# Patient Record
Sex: Male | Born: 2000 | Race: Black or African American | Hispanic: No | Marital: Single | Smoking: Never smoker
Health system: Southern US, Community
[De-identification: ages and names within clinical notes are randomized; demographics above are authoritative.]

## PROBLEM LIST (undated history)

## (undated) DIAGNOSIS — Z789 Other specified health status: Secondary | ICD-10-CM

## (undated) HISTORY — DX: Other specified health status: Z78.9

## (undated) HISTORY — PX: OTHER SURGICAL HISTORY: SHX169

---
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
## 2022-06-29 IMAGING — US US BREAST*L* LIMITED INC AXILLA
1 series · 4 of 4 positions shown · non-contrast
Comparison: None.

CLINICAL DATA: 18-year-old male with a tender, palpable lump behind
the left nipple for approximately 6 months.

EXAM:
ULTRASOUND OF THE LEFT BREAST

[Series 1: us breast*left* limited inc axilla · 0.06mm/px · 4 of 4 slices shown]
[im 1/4]
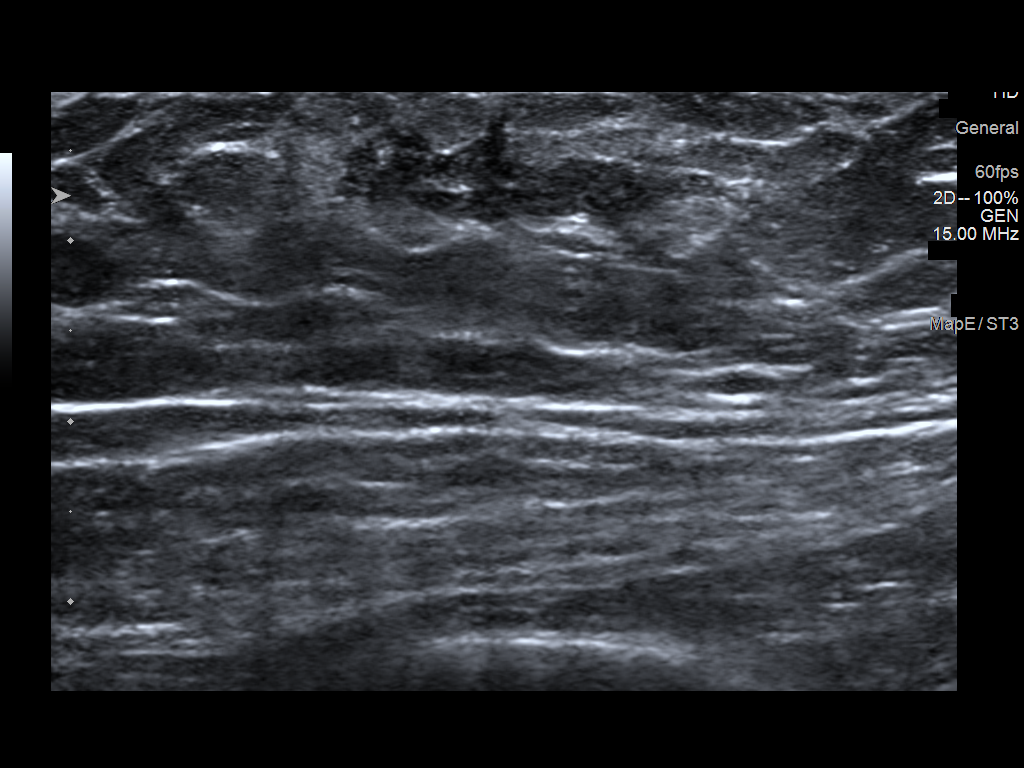
[im 2/4]
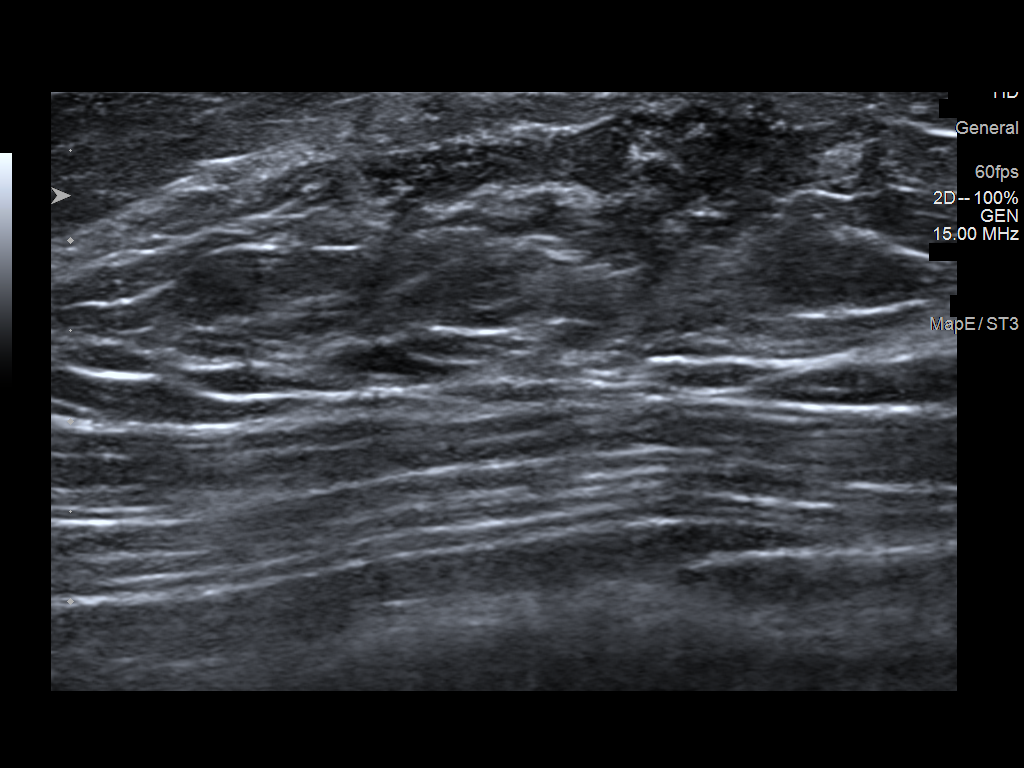
[im 3/4]
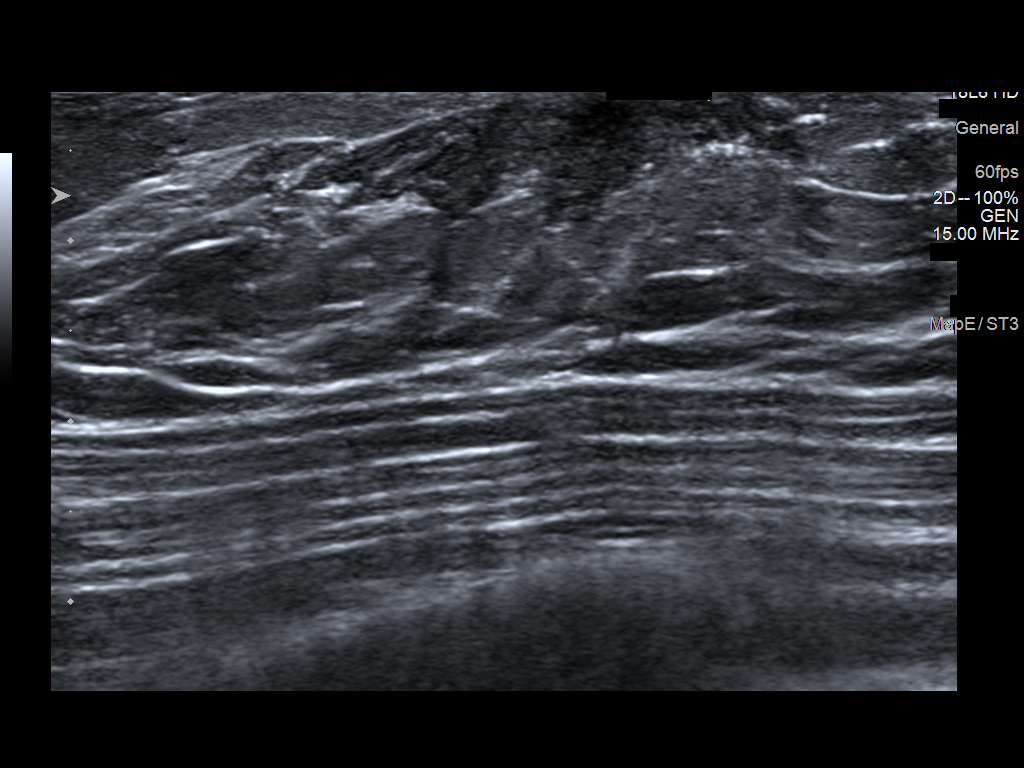
[im 4/4]
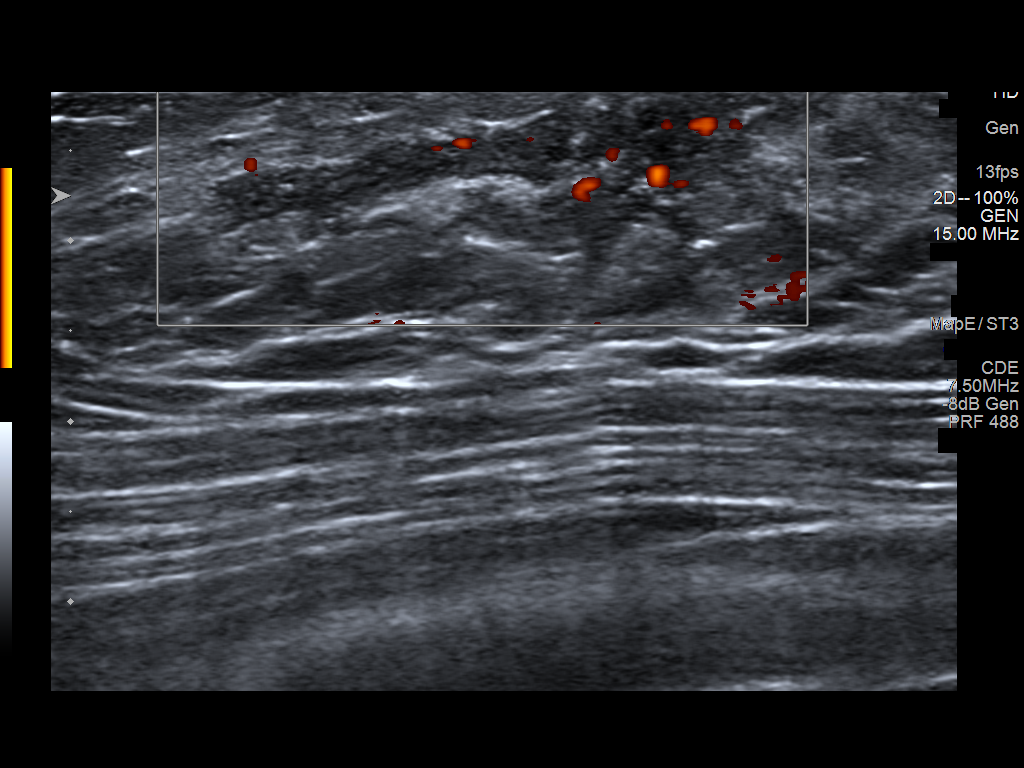

[4 of 4 positions shown; findings below may reference images not displayed]

FINDINGS: Targeted ultrasound is performed, showing mild gynecomastia in the
subareolar left breast. No focal or suspicious sonographic
abnormalities are identified.
IMPRESSION: Mild left breast gynecomastia.

RECOMMENDATION:
I discussed with the patient the fact that gynecomastia can occur in
older men as testosterone levels decrease with age or in younger men
with low testosterone levels, causing a change in the serum
testosterone:estrogen ratio. We also discussed other potential
etiologies of gynecomastia including numerous prescription
medications and recreational drugs (marijuana and anabolic steroids
in particular). I counseled the patient to perform self-examination
to make sure that a hard lump does not form which could indicate
malignancy and would need further evaluation. We also discussed the
possibility of surgical excision if symptoms continue and if an
etiology of the gynecomastia cannot be determined and therefore
corrected.

I have discussed the findings and recommendations with the patient.
If applicable, a reminder letter will be sent to the patient
regarding the next appointment.

BI-RADS CATEGORY  2: Benign.

## 2023-03-01 ENCOUNTER — Emergency Department (HOSPITAL_COMMUNITY)
Admission: EM | Admit: 2023-03-01 | Discharge: 2023-03-01 | Disposition: A | Discharge status: Home / Self Care | Payer: Federal, State, Local not specified - PPO | Attending: Emergency Medicine | Admitting: Emergency Medicine

## 2023-03-01 ENCOUNTER — Emergency Department (HOSPITAL_COMMUNITY): Payer: Federal, State, Local not specified - PPO

## 2023-03-01 ENCOUNTER — Encounter (HOSPITAL_COMMUNITY): Payer: Self-pay | Admitting: Emergency Medicine

## 2023-03-01 DIAGNOSIS — B338 Other specified viral diseases: Secondary | ICD-10-CM

## 2023-03-01 DIAGNOSIS — R197 Diarrhea, unspecified: Secondary | ICD-10-CM | POA: Diagnosis not present

## 2023-03-01 DIAGNOSIS — B974 Respiratory syncytial virus as the cause of diseases classified elsewhere: Secondary | ICD-10-CM | POA: Insufficient documentation

## 2023-03-01 DIAGNOSIS — Z20822 Contact with and (suspected) exposure to covid-19: Secondary | ICD-10-CM | POA: Diagnosis not present

## 2023-03-01 DIAGNOSIS — R1084 Generalized abdominal pain: Secondary | ICD-10-CM | POA: Diagnosis present

## 2023-03-01 DIAGNOSIS — R112 Nausea with vomiting, unspecified: Secondary | ICD-10-CM | POA: Insufficient documentation

## 2023-03-01 LAB — URINALYSIS, ROUTINE W REFLEX MICROSCOPIC
Bilirubin Urine: NEGATIVE
Glucose, UA: NEGATIVE mg/dL
Hgb urine dipstick: NEGATIVE
Ketones, ur: NEGATIVE mg/dL
Leukocytes,Ua: NEGATIVE
Nitrite: NEGATIVE
Protein, ur: NEGATIVE mg/dL
Specific Gravity, Urine: 1.024 (ref 1.005–1.030)
pH: 5 (ref 5.0–8.0)

## 2023-03-01 LAB — COMPREHENSIVE METABOLIC PANEL
ALT: 21 U/L (ref 0–44)
AST: 20 U/L (ref 15–41)
Albumin: 4.5 g/dL (ref 3.5–5.0)
Alkaline Phosphatase: 45 U/L (ref 38–126)
Anion gap: 11 (ref 5–15)
BUN: 9 mg/dL (ref 6–20)
CO2: 25 mmol/L (ref 22–32)
Calcium: 9.5 mg/dL (ref 8.9–10.3)
Chloride: 100 mmol/L (ref 98–111)
Creatinine, Ser: 0.77 mg/dL (ref 0.61–1.24)
GFR, Estimated: >60 mL/min (ref >60)
Glucose, Bld: 100 mg/dL — ABNORMAL HIGH (ref 70–99)
Potassium: 3.7 mmol/L (ref 3.5–5.1)
Sodium: 136 mmol/L (ref 135–145)
Total Bilirubin: 0.9 mg/dL (ref 0.0–1.2)
Total Protein: 7.8 g/dL (ref 6.5–8.1)

## 2023-03-01 LAB — CBC
HCT: 45.5 % (ref 39.0–52.0)
Hemoglobin: 15.1 g/dL (ref 13.0–17.0)
MCH: 28.6 pg (ref 26.0–34.0)
MCHC: 33.2 g/dL (ref 30.0–36.0)
MCV: 86.2 fL (ref 80.0–100.0)
Platelets: 330 10*3/uL (ref 150–400)
RBC: 5.28 MIL/uL (ref 4.22–5.81)
RDW: 13.2 % (ref 11.5–15.5)
WBC: 7 10*3/uL (ref 4.0–10.5)
nRBC: 0 % (ref 0.0–0.2)

## 2023-03-01 LAB — LIPASE, BLOOD: Lipase: 43 U/L (ref 11–51)

## 2023-03-01 LAB — RESP PANEL BY RT-PCR (RSV, FLU A&B, COVID)  RVPGX2
Influenza A by PCR: NEGATIVE
Influenza B by PCR: NEGATIVE
Resp Syncytial Virus by PCR: POSITIVE — AB
SARS Coronavirus 2 by RT PCR: NEGATIVE

## 2023-03-01 MED ORDER — OMEPRAZOLE 20 MG PO CPDR
20.0000 mg | DELAYED_RELEASE_CAPSULE | Freq: Every day | ORAL | 0 refills | Status: AC
Start: 2023-03-01 — End: ?

## 2023-03-01 MED ORDER — IOHEXOL 300 MG/ML  SOLN
100.0000 mL | Freq: Once | INTRAMUSCULAR | Status: AC | PRN
  Administered 2023-03-01: 100 mL via INTRAVENOUS

## 2023-03-01 MED ORDER — ONDANSETRON HCL 4 MG PO TABS: 4.0000 mg | ORAL_TABLET | Freq: Three times a day (TID) | ORAL | 0 refills | Status: AC | PRN

## 2023-03-01 NOTE — ED Triage Notes (Signed)
Pt here from home with c/o abd pain along with along with N/V/D off and on over the last 2 weeks

## 2023-03-01 NOTE — ED Provider Notes (Signed)
[place] EMERGENCY DEPARTMENT AT [facility] Provider Note   CSN: [id number] Arrival date & time: 03/01/23  4098     History  Chief Complaint  Patient presents with   Abdominal Pain    Nathaniel Hogan is a 23 y.o. male.  He has no significant past medical history.  He has had GI problems for the last few weeks.  He initially thought it was bad fish, had nausea vomiting diarrhea.  Symptoms recurred again about a week ago with same.  Had ongoing cramps and lack of appetite since then with symptoms worsening last evening.  No fevers or chills.  Generalized abdominal pain.  Thought he felt a bulge come out of his abdominal wall but is not there now.  Has family history of colon cancer and lymphoma and so wanted to get checked out.  Has been trying over-the-counter Tums and probiotics.  Also has some URI symptoms  The history is provided by the patient.  Abdominal Pain Pain location:  Generalized Pain quality: aching   Pain severity:  Moderate Onset quality:  Gradual Duration:  2 weeks Timing:  Intermittent Progression:  Unchanged Chronicity:  New Context: not trauma   Relieved by:  Nothing Worsened by:  Nothing Ineffective treatments:  Antacids Associated symptoms: chest pain, diarrhea, nausea and vomiting   Associated symptoms: no constipation, no cough, no dysuria, no fever, no hematemesis, no hematochezia, no hematuria and no shortness of breath   Risk factors: has not had multiple surgeries        Home Medications Prior to Admission medications   Not on File      Allergies    Patient has no known allergies.    Review of Systems   Review of Systems  Constitutional:  Negative for fever.  Respiratory:  Negative for cough and shortness of breath.   Cardiovascular:  Positive for chest pain.  Gastrointestinal:  Positive for abdominal pain, diarrhea, nausea and vomiting. Negative for constipation, hematemesis and hematochezia.  Genitourinary:  Negative for  dysuria and hematuria.    Physical Exam Updated Vital Signs BP 124/78 (BP Location: Left Arm)   Pulse 60   Temp 98.6 F (37 C) (Oral)   Resp 16   SpO2 98%  Physical Exam Vitals and nursing note reviewed.  Constitutional:      General: He is not in acute distress.    Appearance: He is well-developed.  HENT:     Head: Normocephalic and atraumatic.  Eyes:     Conjunctiva/sclera: Conjunctivae normal.  Cardiovascular:     Rate and Rhythm: Normal rate and regular rhythm.     Heart sounds: No murmur heard. Pulmonary:     Effort: Pulmonary effort is normal. No respiratory distress.     Breath sounds: Normal breath sounds.  Abdominal:     Palpations: Abdomen is soft.     Tenderness: There is no abdominal tenderness. There is no guarding or rebound.  Musculoskeletal:        General: No swelling.     Cervical back: Neck supple.  Skin:    General: Skin is warm and dry.     Capillary Refill: Capillary refill takes less than 2 seconds.  Neurological:     General: No focal deficit present.     Mental Status: He is alert.     ED Results / Procedures / Treatments   Labs (all labs ordered are listed, but only abnormal results are displayed) Labs Reviewed  RESP PANEL BY RT-PCR (RSV, FLU  A&B, COVID)  RVPGX2 - Abnormal; Notable for the following components:      Result Value   Resp Syncytial Virus by PCR POSITIVE (*)    All other components within normal limits  COMPREHENSIVE METABOLIC PANEL - Abnormal; Notable for the following components:   Glucose, Bld 100 (*)    All other components within normal limits  LIPASE, BLOOD  CBC  URINALYSIS, ROUTINE W REFLEX MICROSCOPIC    EKG None  Radiology CT ABDOMEN PELVIS W CONTRAST Result Date: 03/01/2023 CLINICAL DATA:  Two-week history of abdominal pain associated with intermittent nausea, vomiting, and diarrhea EXAM: CT ABDOMEN AND PELVIS WITH CONTRAST TECHNIQUE: Multidetector CT imaging of the abdomen and pelvis was performed using  the standard protocol following bolus administration of intravenous contrast. RADIATION DOSE REDUCTION: This exam was performed according to the departmental dose-optimization program which includes automated exposure control, adjustment of the mA and/or kV according to patient size and/or use of iterative reconstruction technique. CONTRAST:  OMNIPAQUE IOHEXOL 300 MG/ML  SOLN COMPARISON:  None Available. FINDINGS: Lower chest: Subtle tree-in-bud and ground-glass nodules in the left lower lobe. No pleural effusion or pneumothorax demonstrated. Partially imaged heart size is normal. Hepatobiliary: No focal hepatic lesions. No intra or extrahepatic biliary ductal dilation. Normal gallbladder. Pancreas: No focal lesions or main ductal dilation. Spleen: Normal in size without focal abnormality. Adrenals/Urinary Tract: No adrenal nodules. No suspicious renal mass, calculi or hydronephrosis. No focal bladder wall thickening. Stomach/Bowel: Normal appearance of the stomach. No evidence of bowel wall thickening, distention, or inflammatory changes. Normal appendix. Vascular/Lymphatic: No significant vascular findings are present. No enlarged abdominal or pelvic lymph nodes. Reproductive: Prostate is unremarkable. Other: No free fluid, fluid collection, or free air. Musculoskeletal: No acute or abnormal lytic or blastic osseous lesions. IMPRESSION: 1. No acute abdominopelvic findings. 2. Subtle tree-in-bud and ground-glass nodules in the left lower lobe, likely infectious or inflammatory. Electronically Signed   By: Agustin Cree M.D.   On: 03/01/2023 11:38    Procedures Procedures    Medications Ordered in ED Medications - No data to display  ED Course/ Medical Decision Making/ A&P                                 Medical Decision Making Amount and/or Complexity of Data Reviewed Labs: ordered. Radiology: ordered.  Risk Prescription drug management.   This patient complains of nausea vomiting  generalized abdominal pain; this involves an extensive number of treatment Options and is a complaint that carries with it a high risk of complications and morbidity. The differential includes gastritis, biliary colic, infection, colitis, diverticulitis, appendicitis  I ordered, reviewed and interpreted labs, which included CBC normal chemistries LFTs normal urinalysis negative respiratory panel positive for RSV I ordered imaging studies which included CT abdomen and pelvis and I independently    visualized and interpreted imaging which showed no acute findings Additional history obtained from patient's father Previous records obtained and reviewed in epic no recent admissions Social determinants considered, no significant barriers Critical Interventions: None  After the interventions stated above, I reevaluated the patient and found patient to have benign abdominal exam Admission and further testing considered, no indications for admission.  Will start on PPI and given prescription for nausea medication.  Recommended follow-up with primary care.  Return instructions discussed         Final Clinical Impression(s) / ED Diagnoses Final diagnoses:  Generalized abdominal pain  RSV infection    Rx / DC Orders ED Discharge Orders     None         Terrilee Files, MD 03/01/23 [phone]

## 2023-03-01 NOTE — Discharge Instructions (Signed)
You were seen in the emergency department for abdominal pain nausea vomiting and also cold symptoms.  You tested positive for RSV which is an infection that causes cold symptoms.  The rest of your lab work and CAT scan of your abdomen did not show any significant findings.  We are starting you on some acid medication.  Please start with a bland diet advance as tolerated.  Follow-up with your primary care doctor.  Return to the emergency department if any worsening or concerning symptoms

## 2023-04-21 ENCOUNTER — Telehealth: Payer: Self-pay | Admitting: General Practice

## 2023-04-21 NOTE — Telephone Encounter (Signed)
 Contacted pt left vm to confirmed appt

## 2023-04-26 ENCOUNTER — Encounter: Payer: Self-pay | Admitting: Family Medicine

## 2023-04-26 ENCOUNTER — Encounter: Payer: Self-pay | Admitting: General Practice

## 2023-04-26 ENCOUNTER — Ambulatory Visit: Payer: PRIVATE HEALTH INSURANCE | Attending: Family Medicine | Admitting: Family Medicine

## 2023-04-26 VITALS — BP 107/66 | HR 50 | Ht 76.0 in | Wt 255.6 lb

## 2023-04-26 DIAGNOSIS — Z1159 Encounter for screening for other viral diseases: Secondary | ICD-10-CM | POA: Diagnosis not present

## 2023-04-26 DIAGNOSIS — Z131 Encounter for screening for diabetes mellitus: Secondary | ICD-10-CM | POA: Diagnosis not present

## 2023-04-26 DIAGNOSIS — Z13228 Encounter for screening for other metabolic disorders: Secondary | ICD-10-CM

## 2023-04-26 DIAGNOSIS — Z0000 Encounter for general adult medical examination without abnormal findings: Secondary | ICD-10-CM

## 2023-04-26 NOTE — Progress Notes (Signed)
 Subjective:  Patient ID: Nathaniel Hogan, male    DOB: 2000-10-11  Age: 23 y.o. MRN: [id number]  CC: New Patient (Initial Visit)     Discussed the use of AI scribe software for clinical note transcription with the patient, who gave verbal consent to proceed.  History of Present Illness The patient presents for an annual physical. He reports no specific concerns or symptoms. He has a history of seeing a primary care doctor, but does not recall the details. He regularly sees a Education officer, community and an optometrist. His diet includes a significant amount of junk food, but he also consumes fruits and vegetables. He describes himself as being mobile and constantly moving, but does not engage in specific exercise routines. He has no known family history of diabetes or hypertension. He is sexually active. He had blood work done a couple of months ago during an emergency room visit, which included checks for kidney and liver function and anemia.    Past Medical History:  Diagnosis Date   No pertinent past medical history     Past Surgical History:  Procedure Laterality Date   NO PAST SURGERY      Family History  Problem Relation Age of Onset   Cancer Father    Kidney disease Maternal Grandfather    Diabetes Paternal Grandmother     Social History   Socioeconomic History   Marital status: Single    Spouse name: n/a   Number of children: 0   Years of education: Not on file   Highest education level: 12th grade  Occupational History   Occupation: Consulting civil engineer    Comment: [employer]  Tobacco Use   Smoking status: Never   Smokeless tobacco: Never  Vaping Use   Vaping status: Some Days  Substance and Sexual Activity   Alcohol use: Yes   Drug use: No   Sexual activity: Never  Other Topics Concern   Not on file  Social History Narrative   Lives with both parents and two siblings in the same household.   Social Drivers of Health   Financial Resource Strain: High Risk  (04/26/2023)   Overall Financial Resource Strain (CARDIA)    Difficulty of Paying Living Expenses: Very hard  Food Insecurity: Food Insecurity Present (04/26/2023)   Hunger Vital Sign    Worried About Running Out of Food in the Last Year: Sometimes true    Ran Out of Food in the Last Year: Sometimes true  Transportation Needs: No Transportation Needs (04/26/2023)   PRAPARE - Administrator, Civil Service (Medical): No    Lack of Transportation (Non-Medical): No  Physical Activity: Insufficiently Active (04/26/2023)   Exercise Vital Sign    Days of Exercise per Week: 1 day    Minutes of Exercise per Session: 10 min  Stress: Stress Concern Present (04/26/2023)   [employer] of Occupational Health - Occupational Stress Questionnaire    Feeling of Stress : To some extent  Social Connections: Moderately Isolated (04/26/2023)   Social Connection and Isolation Panel [NHANES]    Frequency of Communication with Friends and Family: Three times a week    Frequency of Social Gatherings with Friends and Family: Twice a week    Attends Religious Services: More than 4 times per year    Active Member of [employer] or Organizations: No    Attends Banker Meetings: Not on file    Marital Status: Never married    No Known Allergies  Outpatient Medications  Prior to Visit  Medication Sig Dispense Refill   omeprazole (PRILOSEC) 20 MG capsule Take 1 capsule (20 mg total) by mouth daily. 30 capsule 0   ondansetron (ZOFRAN) 4 MG tablet Take 1 tablet (4 mg total) by mouth every 8 (eight) hours as needed for nausea or vomiting. 15 tablet 0   No facility-administered medications prior to visit.     ROS Review of Systems  Constitutional:  Negative for activity change and appetite change.  HENT:  Negative for sinus pressure and sore throat.   Respiratory:  Negative for chest tightness, shortness of breath and wheezing.   Cardiovascular:  Negative for chest pain and palpitations.   Gastrointestinal:  Negative for abdominal distention, abdominal pain and constipation.  Genitourinary: Negative.   Musculoskeletal: Negative.   Psychiatric/Behavioral:  Negative for behavioral problems and dysphoric mood.     Objective:  BP 107/66   Pulse (!) 50   Ht 6\' 4"  (1.93 m)   Wt 255 lb 9.6 oz (115.9 kg)   SpO2 99%   BMI 31.11 kg/m      04/26/2023    9:41 AM 03/01/2023    7:37 AM 09/23/2013    8:40 AM  BP/Weight  Systolic BP 107 124 100  Diastolic BP 66 78 60  Wt. (Lbs) 255.6  169  BMI 31.11 kg/m2  25.7 kg/m2      Physical Exam Constitutional:      Appearance: He is well-developed.  Cardiovascular:     Rate and Rhythm: Bradycardia present.     Heart sounds: Normal heart sounds. No murmur heard. Pulmonary:     Effort: Pulmonary effort is normal.     Breath sounds: Normal breath sounds. No wheezing or rales.  Chest:     Chest wall: No tenderness.  Abdominal:     General: Bowel sounds are normal. There is no distension.     Palpations: Abdomen is soft. There is no mass.     Tenderness: There is no abdominal tenderness.  Musculoskeletal:        General: Normal range of motion.     Right lower leg: No edema.     Left lower leg: No edema.  Neurological:     Mental Status: He is alert and oriented to person, place, and time.  Psychiatric:        Mood and Affect: Mood normal.        Latest Ref Rng & Units 03/01/2023    8:10 AM  CMP  Glucose 70 - 99 mg/dL 956   BUN 6 - 20 mg/dL 9   Creatinine 2.13 - 0.86 mg/dL 5.78   Sodium 469 - 629 mmol/L 136   Potassium 3.5 - 5.1 mmol/L 3.7   Chloride 98 - 111 mmol/L 100   CO2 22 - 32 mmol/L 25   Calcium 8.9 - 10.3 mg/dL 9.5   Total Protein 6.5 - 8.1 g/dL 7.8   Total Bilirubin 0.0 - 1.2 mg/dL 0.9   Alkaline Phos 38 - 126 U/L 45   AST 15 - 41 U/L 20   ALT 0 - 44 U/L 21     Lipid Panel  No results found for: "CHOL", "TRIG", "HDL", "CHOLHDL", "VLDL", "LDLCALC", "LDLDIRECT"  CBC    Component Value Date/Time    WBC 7.0 03/01/2023 0810   RBC 5.28 03/01/2023 0810   HGB 15.1 03/01/2023 0810   HCT 45.5 03/01/2023 0810   PLT 330 03/01/2023 0810   MCV 86.2 03/01/2023 0810   MCH 28.6 03/01/2023  0810   MCHC 33.2 03/01/2023 0810   RDW 13.2 03/01/2023 0810    No results found for: "HGBA1C"     Assessment & Plan General Health Maintenance Routine physical examination. Advised on diet improvement and exercise.  Updated HPV vaccination series - Order blood tests for diabetes and hyperlipidemia screening. - Administer second dose of HPV vaccine if not up to date. - Perform STD screening including hepatitis C and HIV. - Advise increasing intake of fruits and vegetables to five servings per day. - Recommend 150 minutes of moderate-intensity exercise per week. - Notify via MyChart about blood test results. - Provide work note as requested.      No orders of the defined types were placed in this encounter.   Follow-up: Return in about 1 year (around 04/25/2024) for CPE/ Preventive Health Exam.       Hoy Register, MD, FAAFP. [facility] [place], [place] [phone]   04/26/2023, 10:31 AM

## 2023-04-26 NOTE — Patient Instructions (Signed)
 VISIT SUMMARY:  You came in today for your annual physical examination. You reported no specific concerns or symptoms. We discussed your diet, exercise habits, and general health maintenance. You also received advice on improving your diet and exercise routine.  YOUR PLAN:  -GENERAL HEALTH MAINTENANCE: A routine physical examination was performed. You were advised to improve your diet by increasing your intake of fruits and vegetables to five servings per day and to engage in 150 minutes of moderate-intensity exercise per week. Blood tests were ordered to screen for diabetes and high cholesterol. You will be notified of the results via MyChart. Additionally, you received the second dose of the HPV vaccine, and STD screening including hepatitis C and HIV was performed.  INSTRUCTIONS:  Please follow up on MyChart for your blood test results. Continue with the advised dietary and exercise improvements. If you have any questions or concerns, do not hesitate to contact our office.

## 2023-04-27 ENCOUNTER — Encounter: Payer: Self-pay | Admitting: Family Medicine

## 2023-04-27 LAB — HCV INTERPRETATION

## 2023-04-27 LAB — LP+NON-HDL CHOLESTEROL
Cholesterol, Total: 165 mg/dL (ref 100–199)
HDL: 47 mg/dL (ref >39)
LDL Chol Calc (NIH): 104 mg/dL — ABNORMAL HIGH (ref 0–99)
Total Non-HDL-Chol (LDL+VLDL): 118 mg/dL (ref 0–129)
Triglycerides: 75 mg/dL (ref 0–149)
VLDL Cholesterol Cal: 14 mg/dL (ref 5–40)

## 2023-04-27 LAB — HEMOGLOBIN A1C
Est. average glucose Bld gHb Est-mCnc: 120 mg/dL
Hgb A1c MFr Bld: 5.8 % — ABNORMAL HIGH (ref 4.8–5.6)

## 2023-04-27 LAB — HIV ANTIBODY (ROUTINE TESTING W REFLEX): HIV Screen 4th Generation wRfx: NONREACTIVE

## 2023-04-27 LAB — HCV AB W REFLEX TO QUANT PCR: HCV Ab: NONREACTIVE

## 2023-05-19 NOTE — Telephone Encounter (Signed)
 Unable to reach patient by phone. Voicemail left to return call to schedule appointment.

## 2023-07-27 ENCOUNTER — Other Ambulatory Visit: Payer: Self-pay | Admitting: Family Medicine

## 2023-07-27 ENCOUNTER — Encounter: Payer: Self-pay | Admitting: Family Medicine

## 2023-07-27 DIAGNOSIS — F419 Anxiety disorder, unspecified: Secondary | ICD-10-CM

## 2023-07-27 DIAGNOSIS — R41840 Attention and concentration deficit: Secondary | ICD-10-CM

## 2023-11-11 ENCOUNTER — Emergency Department (HOSPITAL_COMMUNITY)

## 2023-11-11 ENCOUNTER — Emergency Department (HOSPITAL_COMMUNITY)
Admission: EM | Admit: 2023-11-11 | Discharge: 2023-11-11 | Disposition: A | Discharge status: Home / Self Care | Attending: Emergency Medicine | Admitting: Emergency Medicine

## 2023-11-11 DIAGNOSIS — S62327A Displaced fracture of shaft of fifth metacarpal bone, left hand, initial encounter for closed fracture: Secondary | ICD-10-CM | POA: Diagnosis not present

## 2023-11-11 DIAGNOSIS — S60512A Abrasion of left hand, initial encounter: Secondary | ICD-10-CM | POA: Diagnosis not present

## 2023-11-11 DIAGNOSIS — W228XXA Striking against or struck by other objects, initial encounter: Secondary | ICD-10-CM | POA: Insufficient documentation

## 2023-11-11 DIAGNOSIS — M79642 Pain in left hand: Secondary | ICD-10-CM | POA: Diagnosis present

## 2023-11-11 MED ORDER — HYDROCODONE-ACETAMINOPHEN 5-325 MG PO TABS: 2.0000 | ORAL_TABLET | ORAL | 0 refills | Status: AC | PRN

## 2023-11-11 MED ORDER — LIDOCAINE HCL 2 % IJ SOLN
15.0000 mL | Freq: Once | INTRAMUSCULAR | Status: AC
  Administered 2023-11-11: 300 mg
  Filled 2023-11-11: qty 20

## 2023-11-11 MED ORDER — HYDROCODONE-ACETAMINOPHEN 5-325 MG PO TABS
2.0000 | ORAL_TABLET | Freq: Once | ORAL | Status: AC
  Administered 2023-11-11: 2 via ORAL
  Filled 2023-11-11: qty 2

## 2023-11-11 NOTE — Progress Notes (Signed)
 Orthopedic Tech Progress Note Patient Details:  Nathaniel Hogan 2001/01/28 [id number]  Patient stated he did not want arm sling applied. Ortho Devices Type of Ortho Device: Ulna gutter splint Ortho Device/Splint Location: LUE Ortho Device/Splint Interventions: Application   Post Interventions Patient Tolerated: Well Instructions Provided: Care of device  [name] E [name] 11/11/2023, 2:18 PM

## 2023-11-11 NOTE — ED Provider Notes (Signed)
 [place] EMERGENCY DEPARTMENT AT [facility] Provider Note   CSN: [id number] Arrival date & time: 11/11/23  9051     Patient presents with: Hand Pain   Nathaniel Hogan is a 23 y.o. male who presents with acute pain in the left hand after striking the front of his refrigerator with his fist in frustration.  This was followed by immediate pain and swelling and the medial extent of the left hand on the dorsum of the hand.  He also has pain with movement of the fingers of the right hand.  Denies have any numbness or tingling over the same, denies having any other injuries and denies any other symptoms at present.  Rates his pain as 4/10, localized to the area of injury.    Hand Pain       Prior to Admission medications   Medication Sig Start Date End Date Taking? Authorizing Provider  HYDROcodone-acetaminophen (NORCO/VICODIN) 5-325 MG tablet Take 2 tablets by mouth every 4 (four) hours as needed. 11/11/23  Yes Myriam Dorn BROCKS, PA  omeprazole  (PRILOSEC) 20 MG capsule Take 1 capsule (20 mg total) by mouth daily. 03/01/23   Towana Ozell BROCKS, MD  ondansetron  (ZOFRAN ) 4 MG tablet Take 1 tablet (4 mg total) by mouth every 8 (eight) hours as needed for nausea or vomiting. 03/01/23   Towana Ozell BROCKS, MD    Allergies: Patient has no known allergies.    Review of Systems  Musculoskeletal:  Positive for arthralgias and joint swelling.  All other systems reviewed and are negative.   Updated Vital Signs BP 122/73 (BP Location: Right Arm)   Pulse (!) 57   Temp 97.9 F (36.6 C) (Oral)   Resp 16   SpO2 99%   Physical Exam Vitals and nursing note reviewed.  Constitutional:      General: He is not in acute distress.    Appearance: Normal appearance.  HENT:     Head: Normocephalic and atraumatic.     Mouth/Throat:     Mouth: Mucous membranes are moist.     Pharynx: Oropharynx is clear.  Eyes:     Extraocular Movements: Extraocular movements intact.      Conjunctiva/sclera: Conjunctivae normal.     Pupils: Pupils are equal, round, and reactive to light.  Cardiovascular:     Rate and Rhythm: Normal rate and regular rhythm.     Pulses: Normal pulses.     Heart sounds: Normal heart sounds. No murmur heard.    No friction rub. No gallop.  Pulmonary:     Effort: Pulmonary effort is normal.     Breath sounds: Normal breath sounds.  Abdominal:     General: Abdomen is flat. Bowel sounds are normal.     Palpations: Abdomen is soft.  Musculoskeletal:     Right hand: Normal.     Left hand: Swelling, tenderness and bony tenderness present. Decreased range of motion.     Cervical back: Normal range of motion and neck supple.     Right lower leg: No edema.     Left lower leg: No edema.     Comments: Over the fifth MCP, appreciate exquisite tenderness as well as obvious edema.  Further edema over the fourth MCP.  Decreased flexion of the affected finger secondary to pain with movement.  Skin:    General: Skin is warm and dry.     Capillary Refill: Capillary refill takes less than 2 seconds.     Findings: Abrasion present.  Comments: Minor abrasion over the 2nd and 3rd MCP of the left hand.  Neurological:     General: No focal deficit present.     Mental Status: He is alert. Mental status is at baseline.  Psychiatric:        Mood and Affect: Mood normal.     (all labs ordered are listed, but only abnormal results are displayed) Labs Reviewed - No data to display  EKG: None  Radiology: DG Hand Complete Left Result Date: 11/11/2023 CLINICAL DATA:  Post reduction. EXAM: LEFT HAND - COMPLETE 3+ VIEW COMPARISON:  None Available. FINDINGS: The left hand was imaged in a fiberglass cast with subsequently obscured osseous and soft tissue detail. An acute fracture deformity is seen involving the mid portion of the fifth left metacarpal. There is gross anatomic alignment. No evidence of dislocation. Soft tissue swelling is present along the dorsal  aspect of the left hand at the previously noted fracture site. IMPRESSION: Acute fracture of the fifth left metacarpal with gross anatomic alignment. Electronically Signed   By: Suzen Dials M.D.   On: 11/11/2023 14:37   DG Wrist Complete Left Result Date: 11/11/2023 EXAM: 3 or more VIEW(S) XRAY OF THE LEFT WRIST 11/11/2023 10:14:00 AM COMPARISON: 11/28/2024 CLINICAL HISTORY: Left hand pain, swelling, and injury after punching fridge. Still able to move all fingers. FINDINGS: BONES AND JOINTS: Transverse fracture of the distal diaphysis fifth metacarpal with palmar angulation of the distal fracture fragment. Carpal row s intact. No joint dislocation. SOFT TISSUES: The soft tissues are unremarkable. IMPRESSION: 1. Transverse fracture of the distal fifth metacarpal with palmar angulation. Electronically signed by: Katheleen Faes MD 11/11/2023 10:25 AM EDT RP Workstation: [id number]   DG Hand Complete Left Result Date: 11/11/2023 EXAM: 3 OR MORE VIEW(S) XRAY OF THE LEFT HAND 11/11/2023 10:14:00 AM COMPARISON: 03/26/2004 CLINICAL HISTORY: Left hand pain, swelling, and injury after punching fridge. Still able to move fingers. FINDINGS: BONES AND JOINTS: Interval growth. Transverse fracture across the distal diaphysis fifth metacarpal, with mild palmar angulation of the distal fracture fragment. No intraarticular extension. No joint dislocation. SOFT TISSUES: The soft tissues are unremarkable. IMPRESSION: 1. Transverse fracture of the distal fifth metacarpal diaphysis with palmar angulation. 2. No intra-articular extension. Electronically signed by: Katheleen Faes MD 11/11/2023 10:24 AM EDT RP Workstation: [id number]     .Reduction of fracture  Date/Time: 11/11/2023 1:49 PM  Performed by: Myriam Dorn BROCKS, PA Authorized by: Myriam Dorn BROCKS, PA  Consent: Verbal consent obtained Risks and benefits: risks, benefits and alternatives were discussed Consent given by: patient Patient understanding:  patient states understanding of the procedure being performed Imaging studies: imaging studies available Required items: required blood products, implants, devices, and special equipment available Patient identity confirmed: verbally with patient, provided demographic data and arm band Local anesthesia used: yes Anesthesia: hematoma block  Anesthesia: Local anesthesia used: yes Local Anesthetic: lidocaine 2% without epinephrine Anesthetic total: 7 mL  Sedation: Patient sedated: no  Patient tolerance: patient tolerated the procedure well with no immediate complications Comments: Postreduction imaging to be obtained.      Medications Ordered in the ED  HYDROcodone-acetaminophen (NORCO/VICODIN) 5-325 MG per tablet 2 tablet (2 tablets Oral Given 11/11/23 1022)  lidocaine (XYLOCAINE) 2 % (with pres) injection 300 mg (300 mg Infiltration Given by Other 11/11/23 1345)  Medical Decision Making Amount and/or Complexity of Data Reviewed Radiology: ordered.  Risk Prescription drug management.   Medical Decision Making:   Nathaniel Hogan is a 23 y.o. male who presented to the ED today with left hand pain detailed above.     Complete initial physical exam performed, notably the patient  was alert and oriented with no apparent distress, physical exam as noted with notable findings of swelling and tenderness over the 4th and 5th MCP of the left hand..    Reviewed and confirmed nursing documentation for past medical history, family history, social history.    Initial Assessment:   With the patient's presentation of left hand pain after injury, most likely diagnosis is fracture of the 4th and 5th metacarpals of the left hand.   Initial Plan:  Obtain plain film imaging of the left hand and wrist to assess for acute fractures. Objective evaluation as below reviewed   Initial Study Results:    Radiology:  All images reviewed independently. Agree with  radiology report at this time.   DG Hand Complete Left Result Date: 11/11/2023 CLINICAL DATA:  Post reduction. EXAM: LEFT HAND - COMPLETE 3+ VIEW COMPARISON:  None Available. FINDINGS: The left hand was imaged in a fiberglass cast with subsequently obscured osseous and soft tissue detail. An acute fracture deformity is seen involving the mid portion of the fifth left metacarpal. There is gross anatomic alignment. No evidence of dislocation. Soft tissue swelling is present along the dorsal aspect of the left hand at the previously noted fracture site. IMPRESSION: Acute fracture of the fifth left metacarpal with gross anatomic alignment. Electronically Signed   By: Suzen Dials M.D.   On: 11/11/2023 14:37   DG Wrist Complete Left Result Date: 11/11/2023 EXAM: 3 or more VIEW(S) XRAY OF THE LEFT WRIST 11/11/2023 10:14:00 AM COMPARISON: 11/28/2024 CLINICAL HISTORY: Left hand pain, swelling, and injury after punching fridge. Still able to move all fingers. FINDINGS: BONES AND JOINTS: Transverse fracture of the distal diaphysis fifth metacarpal with palmar angulation of the distal fracture fragment. Carpal row s intact. No joint dislocation. SOFT TISSUES: The soft tissues are unremarkable. IMPRESSION: 1. Transverse fracture of the distal fifth metacarpal with palmar angulation. Electronically signed by: Katheleen Faes MD 11/11/2023 10:25 AM EDT RP Workstation: [id number]   DG Hand Complete Left Result Date: 11/11/2023 EXAM: 3 OR MORE VIEW(S) XRAY OF THE LEFT HAND 11/11/2023 10:14:00 AM COMPARISON: 03/26/2004 CLINICAL HISTORY: Left hand pain, swelling, and injury after punching fridge. Still able to move fingers. FINDINGS: BONES AND JOINTS: Interval growth. Transverse fracture across the distal diaphysis fifth metacarpal, with mild palmar angulation of the distal fracture fragment. No intraarticular extension. No joint dislocation. SOFT TISSUES: The soft tissues are unremarkable. IMPRESSION: 1. Transverse  fracture of the distal fifth metacarpal diaphysis with palmar angulation. 2. No intra-articular extension. Electronically signed by: Katheleen Faes MD 11/11/2023 10:24 AM EDT RP Workstation: [id number]      Consults: Case discussed with [facility] hand surgery.   Reassessment and Plan:   Consulted with hand surgery and they suggested hematoma block and manual reduction and referral for outpatient surgical intervention given the fracture.  This was explained to the patient and they understood and agree, manual reduction and splinting taken place as noted in the procedure note.  Post film imaging obtained, shows that the fracture is returned to largely anatomic position.  Ulnar gutter placed.  Referral to [facility] hand surgery has been provided, with discussion had with patient to follow-up for definitive management  of his boxer's fracture.  He understands and agrees, I provided him with a short course of narcotic pain medication to manage his fracture associated pain.       Final diagnoses:  Closed displaced fracture of shaft of fifth metacarpal bone of left hand, initial encounter    ED Discharge Orders          Ordered    HYDROcodone-acetaminophen (NORCO/VICODIN) 5-325 MG tablet  Every 4 hours PRN        11/11/23 1441               Myriam Dorn BROCKS, [place] 11/11/23 1451    Freddi Hamilton, MD 11/14/23 1642

## 2023-11-11 NOTE — Discharge Instructions (Addendum)
 You were provided with a prescription for continued pain medication to be used as needed, a splint has been applied, and x-rays after the reduction do show that it is in good alignment.  Please follow-up with the hand surgeon referral that has been provided to you for definitive surgical management of your fracture.

## 2023-11-11 NOTE — ED Triage Notes (Signed)
 Punched fridge PTA and injured left hand/knuckled. Pain and swelling to hand area. Still able to move all fingers.

## 2023-11-14 NOTE — ED Notes (Signed)
 11/14/23 1035 pt called requesting a work note, opened chart to reprint.

## 2023-12-08 ENCOUNTER — Encounter: Payer: Self-pay | Admitting: Family Medicine

## 2023-12-09 ENCOUNTER — Telehealth: Payer: Self-pay | Admitting: Family Medicine

## 2023-12-09 ENCOUNTER — Emergency Department (HOSPITAL_BASED_OUTPATIENT_CLINIC_OR_DEPARTMENT_OTHER)
Admission: EM | Admit: 2023-12-09 | Discharge: 2023-12-09 | Disposition: A | Discharge status: Home / Self Care | Attending: Emergency Medicine | Admitting: Emergency Medicine

## 2023-12-09 ENCOUNTER — Other Ambulatory Visit: Payer: Self-pay

## 2023-12-09 ENCOUNTER — Encounter (HOSPITAL_BASED_OUTPATIENT_CLINIC_OR_DEPARTMENT_OTHER): Payer: Self-pay

## 2023-12-09 DIAGNOSIS — F419 Anxiety disorder, unspecified: Secondary | ICD-10-CM

## 2023-12-09 DIAGNOSIS — F410 Panic disorder [episodic paroxysmal anxiety] without agoraphobia: Secondary | ICD-10-CM | POA: Insufficient documentation

## 2023-12-09 LAB — TROPONIN T, HIGH SENSITIVITY: Troponin T High Sensitivity: <15 ng/L (ref 0–19)

## 2023-12-09 LAB — CBC WITH DIFFERENTIAL/PLATELET
Abs Immature Granulocytes: 0.04 K/uL (ref 0.00–0.07)
Basophils Absolute: 0 K/uL (ref 0.0–0.1)
Basophils Relative: 0 %
Eosinophils Absolute: 0 K/uL (ref 0.0–0.5)
Eosinophils Relative: 0 %
HCT: 44.9 % (ref 39.0–52.0)
Hemoglobin: 15 g/dL (ref 13.0–17.0)
Immature Granulocytes: 0 %
Lymphocytes Relative: 19 %
Lymphs Abs: 1.7 K/uL (ref 0.7–4.0)
MCH: 28.9 pg (ref 26.0–34.0)
MCHC: 33.4 g/dL (ref 30.0–36.0)
MCV: 86.5 fL (ref 80.0–100.0)
Monocytes Absolute: 0.7 K/uL (ref 0.1–1.0)
Monocytes Relative: 7 %
Neutro Abs: 6.9 K/uL (ref 1.7–7.7)
Neutrophils Relative %: 74 %
Platelets: 267 K/uL (ref 150–400)
RBC: 5.19 MIL/uL (ref 4.22–5.81)
RDW: 13.6 % (ref 11.5–15.5)
WBC: 9.4 K/uL (ref 4.0–10.5)
nRBC: 0 % (ref 0.0–0.2)

## 2023-12-09 LAB — BASIC METABOLIC PANEL WITH GFR
Anion gap: 10 (ref 5–15)
BUN: 10 mg/dL (ref 6–20)
CO2: 26 mmol/L (ref 22–32)
Calcium: 10.2 mg/dL (ref 8.9–10.3)
Chloride: 103 mmol/L (ref 98–111)
Creatinine, Ser: 0.94 mg/dL (ref 0.61–1.24)
GFR, Estimated: >60 mL/min (ref >60)
Glucose, Bld: 109 mg/dL — ABNORMAL HIGH (ref 70–99)
Potassium: 3.9 mmol/L (ref 3.5–5.1)
Sodium: 139 mmol/L (ref 135–145)

## 2023-12-09 MED ORDER — HYDROXYZINE HCL 25 MG PO TABS
25.0000 mg | ORAL_TABLET | Freq: Once | ORAL | Status: AC
  Administered 2023-12-09: 25 mg via ORAL
  Filled 2023-12-09: qty 1

## 2023-12-09 MED ORDER — HYDROXYZINE HCL 25 MG PO TABS: 25.0000 mg | ORAL_TABLET | Freq: Four times a day (QID) | ORAL | 0 refills | Status: DC | PRN

## 2023-12-09 NOTE — Telephone Encounter (Signed)
 Pt confirmed appt

## 2023-12-09 NOTE — ED Provider Notes (Signed)
 [place] EMERGENCY DEPARTMENT AT [facility] Provider Note   CSN: [id number] Arrival date & time: 12/09/23  1401     Patient presents with: Anxiety   Nathaniel Hogan is a 23 y.o. male with history of anxiety, presenting with concern for panic attacks over the past week.  He reports that these panic attacks have been waking him up from sleep and making it hard for him to get sleep.  He states that he will feel very anxious, becomes short of breath, and sometimes feels palpitations during these episodes.  Denies any chest pain.  Denies any suicidal or homicidal ideation.  He reports he is not on any medications for his anxiety currently.  He reports he has had anxiety in the past, but it seem to be related to stressful events such as his mother passing or a break-up.  He denies any particularly stressful events this week.    Anxiety       Prior to Admission medications   Medication Sig Start Date End Date Taking? Authorizing Provider  hydrOXYzine (ATARAX) 25 MG tablet Take 1 tablet (25 mg total) by mouth every 6 (six) hours as needed for anxiety. 12/09/23  Yes Veta Palma, PA-C  HYDROcodone-acetaminophen (NORCO/VICODIN) 5-325 MG tablet Take 2 tablets by mouth every 4 (four) hours as needed. 11/11/23   Myriam Dorn BROCKS, PA  omeprazole  (PRILOSEC) 20 MG capsule Take 1 capsule (20 mg total) by mouth daily. 03/01/23   Towana Ozell BROCKS, MD  ondansetron  (ZOFRAN ) 4 MG tablet Take 1 tablet (4 mg total) by mouth every 8 (eight) hours as needed for nausea or vomiting. 03/01/23   Towana Ozell BROCKS, MD    Allergies: Patient has no known allergies.    Review of Systems  Psychiatric/Behavioral:  Negative for suicidal ideas. The patient is nervous/anxious.     Updated Vital Signs BP 132/80   Pulse (!) 55   Temp 98.7 F (37.1 C) (Oral)   Resp 10   SpO2 99%   Physical Exam Vitals and nursing note reviewed.  Constitutional:      General: He is not in acute distress.     Appearance: He is well-developed.  HENT:     Head: Normocephalic and atraumatic.  Eyes:     Conjunctiva/sclera: Conjunctivae normal.  Cardiovascular:     Rate and Rhythm: Normal rate and regular rhythm.     Heart sounds: No murmur heard. Pulmonary:     Effort: Pulmonary effort is normal. No respiratory distress.     Breath sounds: Normal breath sounds.  Abdominal:     Palpations: Abdomen is soft.     Tenderness: There is no abdominal tenderness.  Musculoskeletal:        General: No swelling.     Cervical back: Neck supple.  Skin:    General: Skin is warm and dry.     Capillary Refill: Capillary refill takes less than 2 seconds.  Neurological:     Mental Status: He is alert.  Psychiatric:        Mood and Affect: Mood normal.        Behavior: Behavior normal.        Thought Content: Thought content normal.        Judgment: Judgment normal.     (all labs ordered are listed, but only abnormal results are displayed) Labs Reviewed  BASIC METABOLIC PANEL WITH GFR - Abnormal; Notable for the following components:      Result Value   Glucose, Bld 109 (*)  All other components within normal limits  CBC WITH DIFFERENTIAL/PLATELET  TROPONIN T, HIGH SENSITIVITY    EKG: None  Radiology: No results found.   Procedures   Medications Ordered in the ED  hydrOXYzine (ATARAX) tablet 25 mg (25 mg Oral Given 12/09/23 1746)                                    Medical Decision Making Amount and/or Complexity of Data Reviewed Labs: ordered.  Risk Prescription drug management.     Differential diagnosis includes but is not limited to anxiety, arrhythmia, electrolyte abnormality, thyroid abnormality  ED Course:  Upon initial evaluation, patient is very well-appearing, no acute distress.  Normal vitals.  He is reporting concern for anxiety attacks that have been more frequent over the past week.  During these attacks, he does feel some palpitations and shortness of breath.   No symptoms currently.  Labs Ordered: I Ordered, and personally interpreted labs.  The pertinent results include:   CBC and BMP within normal limits Troponin within normal limits  Cardiac Monitoring: / EKG: The patient was maintained on a cardiac monitor.  I personally viewed and interpreted the cardiac monitored which showed an underlying rhythm of: Sinus bradycardia   Medications Given: Hydroxyzine  Upon re-evaluation, patient reports he is feeling well.  His lab work including CBC, BMP, and troponin are within normal limits.  Low concern for cardiac etiology given symptoms arise only when he is having panic attacks, and he has an EKG without significant arrhythmia, troponin within normal limits.  EKG is bradycardic with rate of 55, but patient does not feel short of breath now.  This is likely his baseline.  Feel he is medically stable at this time.  Will treat at home with hydroxyzine for his anxiety.  He states he has a follow-up appointment with his PCP in 3 days, will have him follow-up with PCP for further management.  He denies any SI, HI, no indication for psychiatric hospitalization at this time.    Impression: Anxiety  Disposition:  The patient was discharged home with instructions to take hydroxyzine as needed.  Follow-up with PCP as appointment in 3 days. Return precautions given and patient verbalized understanding.   This chart was dictated using voice recognition software, Dragon. Despite the best efforts of this provider to proofread and correct errors, errors may still occur which can change documentation meaning.       Final diagnoses:  Anxiety    ED Discharge Orders          Ordered    hydrOXYzine (ATARAX) 25 MG tablet  Every 6 hours PRN        12/09/23 1910               Veta Palma, PA-C 12/09/23 1923    Yolande Lamar BROCKS, MD 12/12/23 [phone]

## 2023-12-09 NOTE — ED Triage Notes (Signed)
 Patient reports 1 week of anxiety or panic attacks. [place] he has had them in the past but it is usually from an event and he said nothing has happened to trigger it. Denies taking medications for anxiety. Denies SI/HI.

## 2023-12-09 NOTE — Discharge Instructions (Signed)
 Your lab work today was reassuring.  Your blood counts, electrolytes, and heart testing was normal.  You have been prescribed hydroxyzine to use as needed for your anxiety attacks.  You may take 1 tablet up to every 6 hours as needed for anxiety.  Please attend your PCP appointment on Monday for further monitoring/treatment of your anxiety symptoms and for further refills if needed.  Please return to the ER for any other new or concerning symptoms

## 2023-12-12 ENCOUNTER — Encounter: Payer: Self-pay | Admitting: Family Medicine

## 2023-12-12 ENCOUNTER — Ambulatory Visit (HOSPITAL_BASED_OUTPATIENT_CLINIC_OR_DEPARTMENT_OTHER): Admitting: Family Medicine

## 2023-12-12 DIAGNOSIS — F419 Anxiety disorder, unspecified: Secondary | ICD-10-CM | POA: Diagnosis not present

## 2023-12-12 MED ORDER — HYDROXYZINE HCL 25 MG PO TABS: 25.0000 mg | ORAL_TABLET | Freq: Three times a day (TID) | ORAL | 1 refills | Status: AC | PRN

## 2023-12-12 NOTE — Progress Notes (Signed)
 Virtual Visit via Video Note  I connected with Nathaniel Hogan, on 12/12/2023 at 8:24 AM by video enabled telemedicine device and verified that I am speaking with the correct person using two identifiers.   Consent: I discussed the limitations, risks, security and privacy concerns of performing an evaluation and management service by telemedicine and the availability of in person appointments. I also discussed with the patient that there may be a patient responsible charge related to this service. The patient expressed understanding and agreed to proceed.   Location of Patient: Home  Location of Provider: Clinic   Persons participating in Telemedicine visit: Nathaniel Hogan Dr. Delbert    Discussed the use of AI scribe software for clinical note transcription with the patient, who gave verbal consent to proceed.  History of Present Illness Nathaniel Hogan is a 23 year old male who presents with anxiety and panic attacks.  He has experienced severe anxiety and panic attacks for the past week and a half, with symptoms including heart palpitations, sweating, and lack of appetite. These episodes have disrupted his sleep and eating habits. He wakes up early in the morning in a panic state, affecting his ability to rest.  He is currently stressed due to being between jobs after a hand injury a month ago and a recent breakup. These stressors may contribute to his anxiety.  He visited the emergency room three days ago and was prescribed hydroxyzine, which he has taken intermittently. The medication provides partial relief but does not fully alleviate symptoms. He takes 25 mg doses, sometimes twice in 12 hours, aiding sleep but not providing a full night's rest.  He is concerned about relying on medication for stress management, as he has never used medication before. His symptoms make him feel emotionally uncontrollable and non-functional.      Past Medical History:  Diagnosis Date    No pertinent past medical history    No Known Allergies  Current Outpatient Medications on File Prior to Visit  Medication Sig Dispense Refill   HYDROcodone-acetaminophen (NORCO/VICODIN) 5-325 MG tablet Take 2 tablets by mouth every 4 (four) hours as needed. 10 tablet 0   omeprazole  (PRILOSEC) 20 MG capsule Take 1 capsule (20 mg total) by mouth daily. 30 capsule 0   ondansetron  (ZOFRAN ) 4 MG tablet Take 1 tablet (4 mg total) by mouth every 8 (eight) hours as needed for nausea or vomiting. 15 tablet 0   No current facility-administered medications on file prior to visit.    ROS: See HPI  Observations/Objective: Awake, alert, oriented x3 Not in acute distress Normal mood      Latest Ref Rng & Units 12/09/2023    5:50 PM 03/01/2023    8:10 AM  CMP  Glucose 70 - 99 mg/dL 890  899   BUN 6 - 20 mg/dL 10  9   Creatinine 9.38 - 1.24 mg/dL 9.05  9.22   Sodium 864 - 145 mmol/L 139  136   Potassium 3.5 - 5.1 mmol/L 3.9  3.7   Chloride 98 - 111 mmol/L 103  100   CO2 22 - 32 mmol/L 26  25   Calcium 8.9 - 10.3 mg/dL 89.7  9.5   Total Protein 6.5 - 8.1 g/dL  7.8   Total Bilirubin 0.0 - 1.2 mg/dL  0.9   Alkaline Phos 38 - 126 U/L  45   AST 15 - 41 U/L  20   ALT 0 - 44 U/L  21     Lipid  Panel     Component Value Date/Time   CHOL 165 04/26/2023 1016   TRIG 75 04/26/2023 1016   HDL 47 04/26/2023 1016   LDLCALC 104 (H) 04/26/2023 1016   LABVLDL 14 04/26/2023 1016    Lab Results  Component Value Date   HGBA1C 5.8 (H) 04/26/2023     Assessment and plan:   Assessment & Plan Anxiety and panic attacks with sleep disturbance Severe anxiety and panic attacks affecting daily functioning. Hydroxyzine provides partial relief. Prozac considered for long-term management. Counseling recommended. - Prescribed hydroxyzine 25 mg every 8 hours as needed. - Referred to counseling services through the [facility]. - Instructed to send a MyChart message if symptoms do not  improve after one week on hydroxyzine to consider starting Prozac. - Given his concern about dependence on medications I have advised him that both Prozac and hydroxyzine are not addictive and if symptoms are controlled after couple months we can consider taking him off these medications.    Meds ordered this encounter  Medications   hydrOXYzine (ATARAX) 25 MG tablet    Sig: Take 1 tablet (25 mg total) by mouth every 8 (eight) hours as needed for anxiety.    Dispense:  60 tablet    Refill:  1    Follow Up Instructions: Keep previously scheduled appointment   I discussed the assessment and treatment plan with the patient. The patient was provided an opportunity to ask questions and all were answered. The patient agreed with the plan and demonstrated an understanding of the instructions.   The patient was advised to call back or seek an in-person evaluation if the symptoms worsen or if the condition fails to improve as anticipated.     I provided 16 minutes total of Telehealth time during this encounter including median intraservice time, reviewing previous notes, investigations, ordering medications, medical decision making, coordinating care and patient verbalized understanding at the end of the visit.     Corrina Sabin, MD, FAAFP. [facility] [place], [place] [phone]   12/12/2023, 8:24 AM

## 2023-12-19 ENCOUNTER — Telehealth: Payer: Self-pay

## 2023-12-19 NOTE — Progress Notes (Unsigned)
 Complex Care Management Note Care Guide Note  12/19/2023 Name: Nathaniel Hogan MRN: [id number] DOB: 05-13-2000   Complex Care Management Outreach Attempts: An unsuccessful telephone outreach was attempted today to offer the patient information about available complex care management services.  Follow Up Plan:  Additional outreach attempts will be made to offer the patient complex care management information and services.   Encounter Outcome:  No Answer-Left voicemail  Leotis Rase [facility], [facility] Guide  Direct Dial: [phone]  Fax [phone]

## 2023-12-20 NOTE — Progress Notes (Unsigned)
 Complex Care Management Note Care Guide Note  12/20/2023 Name: Nathaniel Hogan MRN: [id number] DOB: [birth year]   Complex Care Management Outreach Attempts: A second unsuccessful outreach was attempted today to offer the patient with information about available complex care management services.  Follow Up Plan:  Additional outreach attempts will be made to offer the patient complex care management information and services.   Encounter Outcome:  No Answer-Unable to leave a message  Leotis Rase [facility], [facility] Guide  Direct Dial: [phone]  Fax [phone]

## 2023-12-21 NOTE — Progress Notes (Signed)
 Complex Care Management Note Care Guide Note  12/21/2023 Name: Nathaniel Hogan MRN: [id number] DOB: 04/06/2000   Complex Care Management Outreach Attempts: A third unsuccessful outreach was attempted today to offer the patient with information about available complex care management services.  Follow Up Plan:  No further outreach attempts will be made at this time. We have been unable to contact the patient to offer or enroll patient in complex care management services.  Encounter Outcome:  No Answer-Left voicemail  Leotis Rase [facility], [facility] Guide  Direct Dial: [phone]  Fax [phone]

## 2024-04-25 ENCOUNTER — Encounter: Admitting: Family Medicine
